# Patient Record
Sex: Female | Born: 1985 | Race: Black or African American | Hispanic: No | Marital: Single | Smoking: Former smoker
Health system: Southern US, Community
[De-identification: ages and names within clinical notes are randomized; demographics above are authoritative.]

## PROBLEM LIST (undated history)

## (undated) DIAGNOSIS — B999 Unspecified infectious disease: Secondary | ICD-10-CM

## (undated) DIAGNOSIS — Z8711 Personal history of peptic ulcer disease: Secondary | ICD-10-CM

## (undated) DIAGNOSIS — Z8719 Personal history of other diseases of the digestive system: Secondary | ICD-10-CM

## (undated) HISTORY — DX: Unspecified infectious disease: B99.9

## (undated) HISTORY — PX: TONSILLECTOMY AND ADENOIDECTOMY: SUR1326

---

## 2004-09-21 ENCOUNTER — Emergency Department (HOSPITAL_COMMUNITY): Admission: EM | Admit: 2004-09-21 | Discharge: 2004-09-21 | Payer: Self-pay | Admitting: Emergency Medicine

## 2005-09-23 ENCOUNTER — Emergency Department (HOSPITAL_COMMUNITY): Admission: EM | Admit: 2005-09-23 | Discharge: 2005-09-23 | Payer: Self-pay | Admitting: Emergency Medicine

## 2005-09-24 ENCOUNTER — Emergency Department (HOSPITAL_COMMUNITY): Admission: EM | Admit: 2005-09-24 | Discharge: 2005-09-24 | Payer: Self-pay | Admitting: Emergency Medicine

## 2005-09-26 ENCOUNTER — Emergency Department (HOSPITAL_COMMUNITY): Admission: EM | Admit: 2005-09-26 | Discharge: 2005-09-26 | Payer: Self-pay | Admitting: Emergency Medicine

## 2007-10-18 ENCOUNTER — Emergency Department (HOSPITAL_COMMUNITY): Admission: EM | Admit: 2007-10-18 | Discharge: 2007-10-18 | Payer: Self-pay | Admitting: Emergency Medicine

## 2008-01-05 ENCOUNTER — Emergency Department (HOSPITAL_COMMUNITY): Admission: EM | Admit: 2008-01-05 | Discharge: 2008-01-06 | Payer: Self-pay | Admitting: Emergency Medicine

## 2008-02-03 ENCOUNTER — Other Ambulatory Visit: Payer: Self-pay | Admitting: Family Medicine

## 2008-02-03 ENCOUNTER — Inpatient Hospital Stay (HOSPITAL_COMMUNITY): Admission: AD | Admit: 2008-02-03 | Discharge: 2008-02-07 | Payer: Self-pay | Admitting: Obstetrics

## 2008-03-26 ENCOUNTER — Ambulatory Visit (HOSPITAL_COMMUNITY): Admission: RE | Admit: 2008-03-26 | Discharge: 2008-03-26 | Payer: Self-pay | Admitting: Obstetrics

## 2010-09-24 ENCOUNTER — Encounter: Payer: Self-pay | Admitting: Obstetrics

## 2011-01-19 NOTE — Discharge Summary (Signed)
[name], [name]              ACCOUNT NO.:  [id number]   MEDICAL RECORD NO.:  [id number]          PATIENT TYPE:  INP   LOCATION:  9307                          FACILITY:  [facility]   PHYSICIAN:  Charles A. Clearance Coots, M.D.DATE OF BIRTH:  [birth year]   DATE OF ADMISSION:  02/03/2008  DATE OF DISCHARGE:  02/07/2008                               DISCHARGE SUMMARY   ADMITTING DIAGNOSES:  1. First trimester pregnancy.  2. Nausea.  3. Vomiting.  4. Dehydration.   DISCHARGE DIAGNOSES:  1. First trimester pregnancy.  2. Nausea.  3. Vomiting.  4. Dehydration.  5. Hyperemesis gravidarum at 11 weeks' gestation, responded well to IV      hydration and antibiotic therapy.  6. Discharged home undelivered at 3 weeks' gestation, much improved      in good condition.   REASON FOR ADMISSION:  A 25 year old G1 at 59 weeks' gestation presents  in transfer from [facility] for severe nausea, vomiting, and  dehydration.  The patient states that she cannot keep anything down.   PAST MEDICAL HISTORY:   SURGERY:  None.   ILLNESSES:  None.   MEDICATIONS:  None.   ALLERGIES:  No known drug allergies.   SOCIAL HISTORY:  Negative for tobacco, alcohol, or recreational drug  use.   FAMILY HISTORY:  No major illnesses listed   REVIEW OF SYSTEMS:  Positive for nausea and vomiting, and lower  abdominal pain.   PHYSICAL EXAMINATION:  GENERAL:  Well-nourished, well-developed female  in no acute distress.  VITAL SIGNS:  Temperature 97.3, pulse 101, respiratory rate 20, and  blood pressure 119/70.  LUNGS:  Clear to auscultation bilaterally.  HEART:  Regular rate and rhythm.  ABDOMEN:  Soft, positive epigastric tenderness.  PELVIC:  Omitted.   ADMITTING LABS:  Urinalysis reveals specific gravity of 1.024, greater  than 88 ketones; otherwise negative.  CBC revealed a hemoglobin of 12,  hematocrit of 36, white blood cell count of 8300, and platelets 218,000.  Comprehensive metabolic panel was  within normal limits.  Amylase and  lipase were within normal limits   HOSPITAL COURSE:  The patient was admitted and started on IV fluid  hydration along with the anti-pneumatic therapy and she slowly responded  quite well.  By hospital day #3, she was tolerating BRAT-type diet and  was able to tolerate fluids by hospital day #4, and was discharged home  undelivered at 11 weeks' gestation, much improved in good condition.   DISCHARGE DISPOSITION:  Medications, Zofran 4 mg p.o. with oral  disintegrating tabs q.6 h p.r.n. Bland-type diet was recommended with  instructions for hyperemesis.  The patient is to call office for  followup appointment in 1 week.      Charles A. Clearance Coots, M.D.  Electronically Signed     CAH/MEDQ  D:  02/27/2008  T:  02/28/2008  Job:  [id number]

## 2011-05-31 LAB — URINALYSIS, ROUTINE W REFLEX MICROSCOPIC
Leukocytes, UA: NEGATIVE
Nitrite: NEGATIVE
Specific Gravity, Urine: 1.028
pH: 6

## 2011-05-31 LAB — COMPREHENSIVE METABOLIC PANEL
BUN: 4 — ABNORMAL LOW
CO2: 20
Chloride: 100
Creatinine, Ser: 0.7
GFR calc non Af Amer: >60
Total Bilirubin: 1

## 2011-05-31 LAB — URINE MICROSCOPIC-ADD ON

## 2011-05-31 LAB — CBC
HCT: 32 — ABNORMAL LOW
MCHC: 33.6
MCV: 87.2
Platelets: 225
RBC: 3.67 — ABNORMAL LOW
WBC: 6.4

## 2011-05-31 LAB — DIFFERENTIAL
Basophils Absolute: 0
Lymphocytes Relative: 12
Neutro Abs: 5.1

## 2011-11-26 ENCOUNTER — Encounter: Payer: Self-pay | Admitting: Obstetrics and Gynecology

## 2011-11-28 ENCOUNTER — Encounter (HOSPITAL_COMMUNITY): Payer: Self-pay | Admitting: Emergency Medicine

## 2011-11-28 ENCOUNTER — Emergency Department (HOSPITAL_COMMUNITY): Payer: Medicaid Other

## 2011-11-28 ENCOUNTER — Emergency Department (HOSPITAL_COMMUNITY)
Admission: EM | Admit: 2011-11-28 | Discharge: 2011-11-28 | Disposition: A | Discharge status: Home Health | Payer: Medicaid Other | Attending: Emergency Medicine | Admitting: Emergency Medicine

## 2011-11-28 DIAGNOSIS — K529 Noninfective gastroenteritis and colitis, unspecified: Secondary | ICD-10-CM

## 2011-11-28 DIAGNOSIS — R197 Diarrhea, unspecified: Secondary | ICD-10-CM | POA: Insufficient documentation

## 2011-11-28 DIAGNOSIS — R109 Unspecified abdominal pain: Secondary | ICD-10-CM | POA: Insufficient documentation

## 2011-11-28 DIAGNOSIS — N200 Calculus of kidney: Secondary | ICD-10-CM

## 2011-11-28 DIAGNOSIS — R51 Headache: Secondary | ICD-10-CM | POA: Insufficient documentation

## 2011-11-28 DIAGNOSIS — M549 Dorsalgia, unspecified: Secondary | ICD-10-CM | POA: Insufficient documentation

## 2011-11-28 DIAGNOSIS — K5289 Other specified noninfective gastroenteritis and colitis: Secondary | ICD-10-CM | POA: Insufficient documentation

## 2011-11-28 DIAGNOSIS — R112 Nausea with vomiting, unspecified: Secondary | ICD-10-CM | POA: Insufficient documentation

## 2011-11-28 DIAGNOSIS — R509 Fever, unspecified: Secondary | ICD-10-CM | POA: Insufficient documentation

## 2011-11-28 HISTORY — DX: Personal history of other diseases of the digestive system: Z87.19

## 2011-11-28 HISTORY — DX: Personal history of peptic ulcer disease: Z87.11

## 2011-11-28 LAB — URINE MICROSCOPIC-ADD ON

## 2011-11-28 LAB — DIFFERENTIAL
Basophils Relative: 0 % (ref 0–1)
Eosinophils Relative: 0 % (ref 0–5)
Lymphs Abs: 0.2 10*3/uL — ABNORMAL LOW (ref 0.7–4.0)
Monocytes Absolute: 0.3 10*3/uL (ref 0.1–1.0)

## 2011-11-28 LAB — URINALYSIS, ROUTINE W REFLEX MICROSCOPIC
Bilirubin Urine: NEGATIVE
Protein, ur: 30 mg/dL — AB
Specific Gravity, Urine: 1.027 (ref 1.005–1.030)
Urobilinogen, UA: 1 mg/dL (ref 0.0–1.0)

## 2011-11-28 LAB — COMPREHENSIVE METABOLIC PANEL
ALT: 14 U/L (ref 0–35)
AST: 20 U/L (ref 0–37)
Albumin: 4.2 g/dL (ref 3.5–5.2)
Alkaline Phosphatase: 74 U/L (ref 39–117)
Potassium: 3.8 mEq/L (ref 3.5–5.1)
Sodium: 136 mEq/L (ref 135–145)
Total Protein: 7.5 g/dL (ref 6.0–8.3)

## 2011-11-28 LAB — CBC
MCH: 26.6 pg (ref 26.0–34.0)
MCV: 81.2 fL (ref 78.0–100.0)
Platelets: 250 10*3/uL (ref 150–400)
RBC: 4.25 MIL/uL (ref 3.87–5.11)

## 2011-11-28 MED ORDER — MORPHINE SULFATE 4 MG/ML IJ SOLN
4.0000 mg | Freq: Once | INTRAMUSCULAR | Status: AC
  Administered 2011-11-28: 4 mg via INTRAVENOUS
  Filled 2011-11-28: qty 1

## 2011-11-28 MED ORDER — ONDANSETRON 8 MG PO TBDP: 8.0000 mg | ORAL_TABLET | Freq: Three times a day (TID) | ORAL | Status: AC | PRN

## 2011-11-28 MED ORDER — ONDANSETRON HCL 4 MG/2ML IJ SOLN
4.0000 mg | Freq: Once | INTRAMUSCULAR | Status: AC
  Administered 2011-11-28: 4 mg via INTRAVENOUS
  Filled 2011-11-28: qty 2

## 2011-11-28 MED ORDER — OXYCODONE-ACETAMINOPHEN 5-325 MG PO TABS
2.0000 | ORAL_TABLET | Freq: Once | ORAL | Status: AC
  Administered 2011-11-28: 2 via ORAL
  Filled 2011-11-28: qty 2

## 2011-11-28 MED ORDER — OXYCODONE-ACETAMINOPHEN 5-325 MG PO TABS: 2.0000 | ORAL_TABLET | Freq: Four times a day (QID) | ORAL | Status: AC | PRN

## 2011-11-28 MED ORDER — SODIUM CHLORIDE 0.9 % IV SOLN
1000.0000 mL | Freq: Once | INTRAVENOUS | Status: AC
  Administered 2011-11-28: 1000 mL via INTRAVENOUS

## 2011-11-28 NOTE — ED Notes (Signed)
Pt given po fluids. 

## 2011-11-28 NOTE — ED Notes (Signed)
ZOX:[id number]<AV> Expected date:11/28/11<BR> Expected time: 9:27 AM<BR> Means of arrival:Ambulance<BR> Comments:<BR> N/v/d

## 2011-11-28 NOTE — ED Notes (Signed)
[facility] brings in pt from home with c/o N/V/D since 2330 last night. Pt also co of abdominal pain described as cramping.

## 2011-11-28 NOTE — ED Provider Notes (Signed)
History     CSN: [id number]  Arrival date & time 11/28/11  4696   First MD Initiated Contact with Patient 11/28/11 0957      Chief Complaint  Patient presents with  . Nausea    (Consider location/radiation/quality/duration/timing/severity/associated sxs/prior treatment) HPI Patient is a 25 roll female who presents today complaining of nausea, vomiting, and diarrhea as well as abdominal pain that began acutely at 11:30 last night. Patient describes her pain as a cramping sensation that is a 10 out of 10. She also complains of some left-sided flank pain. Her abdominal pain is focused on the left upper quadrant. 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 She has not seen any blood in her stool or emesis. She endorses a mild headache with this as well. Patient has history of presentation similar to this in the past where she had pyelonephritis. Patient denies any dysuria, hematuria, or increased urinary frequency. She has no history of nephrolithiasis. Patient denies fevers. Nothing has made her pain better or worse.There are no other associated or modifying factors.  Past Medical History  Diagnosis Date  . History of stomach ulcers     Past Surgical History  Procedure Date  . Tonsillectomy and adenoidectomy     History reviewed. No pertinent family history.  History  Substance Use Topics  . Smoking status: Never Smoker   . Smokeless tobacco: Never Used  . Alcohol Use: No    OB History    Grav Para Term Preterm Abortions TAB SAB Ect Mult Living                  Review of Systems  Constitutional: Positive for chills, appetite change and fatigue.  Eyes: Negative.   Respiratory: Negative.   Cardiovascular: Negative.   Gastrointestinal: Positive for nausea, vomiting, abdominal pain and diarrhea.  Genitourinary: Negative.   Musculoskeletal: Positive for back pain.  Skin: Negative.   Neurological: Positive for headaches.  Hematological: Negative.     Psychiatric/Behavioral: Negative.   All other systems reviewed and are negative.    Allergies  Aspirin and Penicillins  Home Medications  No current outpatient prescriptions on file.  BP 112/58  Pulse 100  Temp(Src) 97.7 F (36.5 C) (Oral)  Resp 14  SpO2 100%  LMP 11/18/2011  Physical Exam  Nursing note and vitals reviewed. GEN: Well-developed, well-nourished female in no distress, very uncomfortable appearing HEENT: Atraumatic, normocephalic. Oropharynx clear without erythema EYES: PERRLA BL, no scleral icterus. NECK: Trachea midline, no meningismus CV: regular rate and rhythm. No murmurs, rubs, or gallops PULM: No respiratory distress.  No crackles, wheezes, or rales. GI: soft, diffuse mild tenderness to palpation. The patient does not have an acute abdomen. No guarding, rebound. + bowel sounds  GU: deferred Neuro: cranial nerves 2-12 intact, no abnormalities of strength or sensation, A and O x 3 MSK: Patient moves all 4 extremities symmetrically, no deformity, edema, or injury noted Skin: No rashes petechiae, purpura, or jaundice Psych: no abnormality of mood   ED Course  Procedures (including critical care time)  Labs Reviewed  URINALYSIS, ROUTINE W REFLEX MICROSCOPIC - Abnormal; Notable for the following:    Hgb urine dipstick MODERATE (*)    Ketones, ur TRACE (*)    Protein, ur 30 (*)    All other components within normal limits  CBC - Abnormal; Notable for the following:    Hemoglobin 11.3 (*)    HCT 34.5 (*)    All other components within  normal limits  DIFFERENTIAL - Abnormal; Notable for the following:    Neutrophils Relative 93 (*)    Lymphocytes Relative 3 (*)    Lymphs Abs 0.2 (*)    All other components within normal limits  URINE MICROSCOPIC-ADD ON - Abnormal; Notable for the following:    Bacteria, UA MANY (*)    All other components within normal limits  COMPREHENSIVE METABOLIC PANEL - Abnormal; Notable for the following:    Glucose, Bld 100  (*)    All other components within normal limits  POCT PREGNANCY, URINE  LIPASE, BLOOD  COMPREHENSIVE METABOLIC PANEL  LIPASE, BLOOD   Ct Abdomen Pelvis Wo Contrast  11/28/2011  *RADIOLOGY REPORT*  Clinical Data: Left flank pain and hematuria  CT ABDOMEN AND PELVIS WITHOUT CONTRAST  Technique:  Multidetector CT imaging of the abdomen and pelvis was performed following the standard protocol without intravenous contrast. Sagittal and coronal MPR images reconstructed from axial data set.  Comparison: None.  Findings: Lung bases clear. Tiny bilateral nonobstructing renal calculi. No hydronephrosis, ureteral dilatation or ureteral calculus identified. Bladder unremarkable. Within limits of a nonenhanced exam, no focal abnormalities of the liver, spleen, pancreas, kidneys, or adrenal glands otherwise seen. Normal-appearing bladder, uterus and adnexae for technique.  Base of appendix is normal appearance, distally obscured by bowel loops. Stomach and bowel loops unremarkable for technique. Scattered pelvic phleboliths. No mass, adenopathy, free fluid or inflammatory process. Tiny umbilical hernia containing fat. No acute osseous findings.  IMPRESSION: No acute intra abdominal or intrapelvic abnormalities. Tiny bilateral nonobstructing renal calculi.  Original Report Authenticated By: Lollie Marrow, M.D.     1. Nephrolithiasis   2. Gastroenteritis       MDM  Patient was evaluated by myself. Based on evaluation patient was treated symptomatically with morphine, IV fluids, and Zofran. Workup for her abdominal pain was performed. This included renal panel, hepatic panel, lipase, urinalysis, urine pregnancy test. There was noted some blood on patient's urinalysis and patient denied having her menses at this time. CT abdomen and pelvis noncontrast performed. Tiny bilateral nonobstructing renal calculi were identified. Patient improved with IV therapy. Following 2 L of IV fluid patient was feeling better.  Patient was discharged home with prescription for Zofran as well as 15 tabs of Vicodin. She was notified of the finding of small renal calculi. Patient and family were comfortable with plan for discharge home. Patient was discharged in good condition.        Cyndra Numbers, MD 11/28/11 [phone]

## 2011-11-28 NOTE — Discharge Instructions (Signed)
Viral Gastroenteritis Viral gastroenteritis is also known as stomach flu. This condition affects the stomach and intestinal tract. It can cause sudden diarrhea and vomiting. The illness typically lasts 3 to 8 days. Most people develop an immune response that eventually gets rid of the virus. While this natural response develops, the virus can make you quite ill. CAUSES  Many different viruses can cause gastroenteritis, such as rotavirus or noroviruses. You can catch one of these viruses by consuming contaminated food or water. You may also catch a virus by sharing utensils or other personal items with an infected person or by touching a contaminated surface. SYMPTOMS  The most common symptoms are diarrhea and vomiting. These problems can cause a severe loss of body fluids (dehydration) and a body salt (electrolyte) imbalance. Other symptoms may include:  Fever.   Headache.   Fatigue.   Abdominal pain.  DIAGNOSIS  Your caregiver can usually diagnose viral gastroenteritis based on your symptoms and a physical exam. A stool sample may also be taken to test for the presence of viruses or other infections. TREATMENT  This illness typically goes away on its own. Treatments are aimed at rehydration. The most serious cases of viral gastroenteritis involve vomiting so severely that you are not able to keep fluids down. In these cases, fluids must be given through an intravenous line (IV). HOME CARE INSTRUCTIONS   Drink enough fluids to keep your urine clear or pale yellow. Drink small amounts of fluids frequently and increase the amounts as tolerated.   Ask your caregiver for specific rehydration instructions.   Avoid:   Foods high in sugar.   Alcohol.   Carbonated drinks.   Tobacco.   Juice.   Caffeine drinks.   Extremely hot or cold fluids.   Fatty, greasy foods.   Too much intake of anything at one time.   Dairy products until 24 to 48 hours after diarrhea stops.   You may  consume probiotics. Probiotics are active cultures of beneficial bacteria. They may lessen the amount and number of diarrheal stools in adults. Probiotics can be found in yogurt with active cultures and in supplements.   Wash your hands well to avoid spreading the virus.   Only take over-the-counter or prescription medicines for pain, discomfort, or fever as directed by your caregiver. Do not give aspirin to children. Antidiarrheal medicines are not recommended.   Ask your caregiver if you should continue to take your regular prescribed and over-the-counter medicines.   Keep all follow-up appointments as directed by your caregiver.  SEEK IMMEDIATE MEDICAL CARE IF:   You are unable to keep fluids down.   You do not urinate at least once every 6 to 8 hours.   You develop shortness of breath.   You notice blood in your stool or vomit. This may look like coffee grounds.   You have abdominal pain that increases or is concentrated in one small area (localized).   You have persistent vomiting or diarrhea.   You have a fever.   The patient is a child younger than 3 months, and he or she has a fever.   The patient is a child older than 3 months, and he or she has a fever and persistent symptoms.   The patient is a child older than 3 months, and he or she has a fever and symptoms suddenly get worse.   The patient is a baby, and he or she has no tears when crying.  MAKE SURE YOU:     Understand these instructions.   Will watch your condition.   Will get help right away if you are not doing well or get worse.  Document Released: 08/20/2005 Document Revised: 08/09/2011 Document Reviewed: 06/06/2011 [facility] Patient Information 2012 [place], [place].Kidney Stones Kidney stones (ureteral lithiasis) are deposits that form inside your kidneys. The intense pain is caused by the stone moving through the urinary tract. When the stone moves, the ureter goes into spasm around the stone. The stone is  usually passed in the urine.  CAUSES   A disorder that makes certain neck glands produce too much parathyroid hormone (primary hyperparathyroidism).   A buildup of uric acid crystals.   Narrowing (stricture) of the ureter.   A kidney obstruction present at birth (congenital obstruction).   Previous surgery on the kidney or ureters.   Numerous kidney infections.  SYMPTOMS   Feeling sick to your stomach (nauseous).   Throwing up (vomiting).   Blood in the urine (hematuria).   Pain that usually spreads (radiates) to the groin.   Frequency or urgency of urination.  DIAGNOSIS   Taking a history and physical exam.   Blood or urine tests.   Computerized X-ray scan (CT scan).   Occasionally, an examination of the inside of the urinary bladder (cystoscopy) is performed.  TREATMENT   Observation.   Increasing your fluid intake.   Surgery may be needed if you have severe pain or persistent obstruction.  The size, location, and chemical composition are all important variables that will determine the proper choice of action for you. Talk to your caregiver to better understand your situation so that you will minimize the risk of injury to yourself and your kidney.  HOME CARE INSTRUCTIONS   Drink enough water and fluids to keep your urine clear or pale yellow.   Strain all urine through the provided strainer. Keep all particulate matter and stones for your caregiver to see. The stone causing the pain may be as small as a grain of salt. It is very important to use the strainer each and every time you pass your urine. The collection of your stone will allow your caregiver to analyze it and verify that a stone has actually passed.   Only take over-the-counter or prescription medicines for pain, discomfort, or fever as directed by your caregiver.   Make a follow-up appointment with your caregiver as directed.   Get follow-up X-rays if required. The absence of pain does not always  mean that the stone has passed. It may have only stopped moving. If the urine remains completely obstructed, it can cause loss of kidney function or even complete destruction of the kidney. It is your responsibility to make sure X-rays and follow-ups are completed. Ultrasounds of the kidney can show blockages and the status of the kidney. Ultrasounds are not associated with any radiation and can be performed easily in a matter of minutes.  SEEK IMMEDIATE MEDICAL CARE IF:   Pain cannot be controlled with the prescribed medicine.   You have a fever.   The severity or intensity of pain increases over 18 hours and is not relieved by pain medicine.   You develop a new onset of abdominal pain.   You feel faint or pass out.  MAKE SURE YOU:   Understand these instructions.   Will watch your condition.   Will get help right away if you are not doing well or get worse.  Document Released: 08/20/2005 Document Revised: 08/09/2011 Document Reviewed: 12/16/2009 [employer] Patient Information  [address] [place].

## 2011-11-28 NOTE — Progress Notes (Signed)
Pt states she has [place] [employer] insurance but does not have card only a letter PCP [employer] Dr Judeth Cornfield Ansel Bong [facility] community liasion services

## 2011-12-17 ENCOUNTER — Encounter: Payer: Self-pay | Admitting: Obstetrics and Gynecology

## 2011-12-31 ENCOUNTER — Encounter: Payer: Self-pay | Admitting: Obstetrics and Gynecology

## 2012-04-29 ENCOUNTER — Ambulatory Visit (INDEPENDENT_AMBULATORY_CARE_PROVIDER_SITE_OTHER): Payer: BC Managed Care – PPO | Admitting: Internal Medicine

## 2012-04-29 ENCOUNTER — Ambulatory Visit: Payer: BC Managed Care – PPO

## 2012-04-29 VITALS — BP 98/62 | HR 91 | Temp 98.6°F | Resp 16 | Ht 63.5 in | Wt 155.6 lb

## 2012-04-29 DIAGNOSIS — R109 Unspecified abdominal pain: Secondary | ICD-10-CM

## 2012-04-29 DIAGNOSIS — K5900 Constipation, unspecified: Secondary | ICD-10-CM

## 2012-04-29 DIAGNOSIS — R319 Hematuria, unspecified: Secondary | ICD-10-CM

## 2012-04-29 DIAGNOSIS — N23 Unspecified renal colic: Secondary | ICD-10-CM

## 2012-04-29 LAB — POCT UA - MICROSCOPIC ONLY: Casts, Ur, LPF, POC: NEGATIVE

## 2012-04-29 LAB — POCT CBC
Hemoglobin: 10.1 g/dL — AB (ref 12.2–16.2)
Lymph, poc: 3.2 (ref 0.6–3.4)
MCHC: 30 g/dL — AB (ref 31.8–35.4)
MID (cbc): 0.4 (ref 0–0.9)
MPV: 8.9 fL (ref 0–99.8)
POC Granulocyte: 2.8 (ref 2–6.9)
POC MID %: 6.5 %M (ref 0–12)
Platelet Count, POC: 288 10*3/uL (ref 142–424)
RDW, POC: 16.6 %

## 2012-04-29 LAB — POCT URINALYSIS DIPSTICK
Bilirubin, UA: NEGATIVE
Ketones, UA: NEGATIVE
Leukocytes, UA: NEGATIVE
pH, UA: 6

## 2012-04-29 MED ORDER — TRAMADOL HCL 50 MG PO TABS: 50.0000 mg | ORAL_TABLET | Freq: Three times a day (TID) | ORAL | Status: AC | PRN

## 2012-04-29 MED ORDER — POLYETHYLENE GLYCOL 3350 17 GM/SCOOP PO POWD: 17.0000 g | Freq: Two times a day (BID) | ORAL | Status: AC | PRN

## 2012-04-29 NOTE — Patient Instructions (Signed)
Miralax 1capful daily until you have a bowel movement. Ultram 1 tab every 4 hours as needed for pain. Increase fluids and fiber in the diet. If your symptoms worsen return to the office.

## 2012-04-29 NOTE — Progress Notes (Signed)
Subjective:    Patient ID: [name], female    DOB: 14-May-1986, 26 y.o.   MRN: [id number]  HPI L Back pain,L flank pain,LUQ abd pain Onset 2 days ago Moderate in severity Nausea No vomiting No fever No dysuria Drank etoh 2 days ago Hx kidney stone and an ulcer   Review of Systems  Constitutional: Positive for appetite change and fatigue.  Gastrointestinal: Positive for abdominal pain.  Genitourinary: Positive for flank pain.  All other systems reviewed and are negative.       Objective:   Physical Exam  Nursing note and vitals reviewed. Constitutional: She is oriented to person, place, and time. She appears well-developed and well-nourished.  HENT:  Head: Normocephalic and atraumatic.  Left Ear: External ear normal.  Nose: Nose normal.  Mouth/Throat: Oropharynx is clear and moist.  Eyes: Conjunctivae and EOM are normal. Pupils are equal, round, and reactive to light.  Neck: Normal range of motion. Neck supple.  Cardiovascular: Normal rate, regular rhythm, normal heart sounds and intact distal pulses.   Pulmonary/Chest: Effort normal and breath sounds normal.  Abdominal: There is tenderness.       Flank pain l  Musculoskeletal: Normal range of motion.  Neurological: She is alert and oriented to person, place, and time. She has normal reflexes.  Skin: Skin is warm and dry.  Psychiatric: She has a normal mood and affect. Her behavior is normal. Judgment and thought content normal.     Results for orders placed in visit on 04/29/12  POCT URINALYSIS DIPSTICK      Component Value Range   Color, UA yellow     Clarity, UA clear     Glucose, UA neg     Bilirubin, UA neg     Ketones, UA neg     Spec Grav, UA >=1.030     Blood, UA trace     pH, UA 6.0     Protein, UA neg     Urobilinogen, UA 0.2     Nitrite, UA neg     Leukocytes, UA Negative    POCT UA - MICROSCOPIC ONLY      Component Value Range   WBC, Ur, HPF, POC 1-3     RBC, urine,  microscopic 2-4     Bacteria, U Microscopic small     Mucus, UA small     Epithelial cells, urine per micros 1-3     Crystals, Ur, HPF, POC neg     Casts, Ur, LPF, POC neg     Yeast, UA neg        Results for orders placed in visit on 04/29/12  POCT URINALYSIS DIPSTICK      Component Value Range   Color, UA yellow     Clarity, UA clear     Glucose, UA neg     Bilirubin, UA neg     Ketones, UA neg     Spec Grav, UA >=1.030     Blood, UA trace     pH, UA 6.0     Protein, UA neg     Urobilinogen, UA 0.2     Nitrite, UA neg     Leukocytes, UA Negative    POCT UA - MICROSCOPIC ONLY      Component Value Range   WBC, Ur, HPF, POC 1-3     RBC, urine, microscopic 2-4     Bacteria, U Microscopic small     Mucus, UA small     Epithelial cells,  urine per micros 1-3     Crystals, Ur, HPF, POC neg     Casts, Ur, LPF, POC neg     Yeast, UA neg    POCT URINE PREGNANCY      Component Value Range   Preg Test, Ur Negative    POCT CBC      Component Value Range   WBC 6.4  4.6 - 10.2 K/uL   Lymph, poc 3.2  0.6 - 3.4   POC LYMPH PERCENT 49.8  10 - 50 %L   MID (cbc) 0.4  0 - 0.9   POC MID % 6.5  0 - 12 %M   POC Granulocyte 2.8  2 - 6.9   Granulocyte percent 43.7  37 - 80 %G   RBC 4.03 (*) 4.04 - 5.48 M/uL   Hemoglobin 10.1 (*) 12.2 - 16.2 g/dL   HCT, POC 16.1 (*) 09.6 - 47.9 %   MCV 83.5  80 - 97 fL   MCH, POC 25.1 (*) 27 - 31.2 pg   MCHC 30.0 (*) 31.8 - 35.4 g/dL   RDW, POC 04.5     Platelet Count, POC 288  142 - 424 K/uL   MPV 8.9  0 - 99.8 fL  cbc normal ua a with trace blood, preg negitive.th [facility] reading (PRIMARY) by  Dr.Hyder Deman increased Stool in left colon no free air no obstruction.  Assessment & Plan:  Flank nad abd pain Will access cbc ua urine preg ua awith blood preg neg xtray with stool in the left colon. Will rx for obstipation with miralax and also will rx with ultram for renal colic. Pt is allergic to asa so will avoid antinflams

## 2012-04-29 NOTE — Progress Notes (Signed)
  Subjective:    Patient ID: [name], female    DOB: 1986/06/14, 26 y.o.   MRN: [id number]  HPI    Review of Systems     Objective:   Physical Exam        Assessment & Plan:  Anemia Menstruating patient

## 2013-02-06 ENCOUNTER — Emergency Department (HOSPITAL_COMMUNITY)
Admission: EM | Admit: 2013-02-06 | Discharge: 2013-02-06 | Disposition: A | Discharge status: Home / Self Care | Payer: BC Managed Care – PPO | Attending: Emergency Medicine | Admitting: Emergency Medicine

## 2013-02-06 DIAGNOSIS — R109 Unspecified abdominal pain: Secondary | ICD-10-CM | POA: Insufficient documentation

## 2013-02-06 DIAGNOSIS — Z8711 Personal history of peptic ulcer disease: Secondary | ICD-10-CM | POA: Insufficient documentation

## 2013-02-06 DIAGNOSIS — Z3202 Encounter for pregnancy test, result negative: Secondary | ICD-10-CM | POA: Insufficient documentation

## 2013-02-06 DIAGNOSIS — R3915 Urgency of urination: Secondary | ICD-10-CM | POA: Insufficient documentation

## 2013-02-06 DIAGNOSIS — R350 Frequency of micturition: Secondary | ICD-10-CM | POA: Insufficient documentation

## 2013-02-06 DIAGNOSIS — N83209 Unspecified ovarian cyst, unspecified side: Secondary | ICD-10-CM

## 2013-02-06 LAB — URINALYSIS, ROUTINE W REFLEX MICROSCOPIC
Ketones, ur: NEGATIVE mg/dL
Leukocytes, UA: NEGATIVE
Nitrite: NEGATIVE
Protein, ur: NEGATIVE mg/dL
Urobilinogen, UA: 0.2 mg/dL (ref 0.0–1.0)

## 2013-02-06 MED ORDER — HYDROCODONE-ACETAMINOPHEN 5-325 MG PO TABS: 1.0000 | ORAL_TABLET | Freq: Four times a day (QID) | ORAL | Status: DC | PRN

## 2013-02-06 MED ORDER — LIDOCAINE HCL (PF) 1 % IJ SOLN
INTRAMUSCULAR | Status: AC
  Administered 2013-02-06: 2 mL via INTRAMUSCULAR
  Filled 2013-02-06: qty 5

## 2013-02-06 MED ORDER — CEFTRIAXONE SODIUM 250 MG IJ SOLR
250.0000 mg | Freq: Once | INTRAMUSCULAR | Status: AC
  Administered 2013-02-06: 250 mg via INTRAMUSCULAR
  Filled 2013-02-06: qty 250

## 2013-02-06 NOTE — ED Notes (Signed)
Patient C/O feeling bladder pressure and urge to urinate beginning Monday.  Symptoms worsened Wednesday and Thursday which prompted today's ED visit.

## 2013-02-06 NOTE — ED Provider Notes (Addendum)
History     CSN: [id number]  Arrival date & time 02/06/13  1112   First MD Initiated Contact with Patient 02/06/13 1124      Chief Complaint  Patient presents with  . Urinary Tract Infection    (Consider location/radiation/quality/duration/timing/severity/associated sxs/prior treatment) Patient is a 27 y.o. female presenting with urinary tract infection. The history is provided by the patient.  Urinary Tract Infection This is a new ( pattiient has had a bladder pressure with urgency for the last 4 days I continues to worsen) problem. The current episode started more than 2 days ago. The problem occurs constantly. The problem has been gradually worsening. Associated symptoms include abdominal pain. Associated symptoms comments: No fever, flank pain, nausea or vomiting. Exacerbated by: Urinating. Relieved by: Urination. She has tried nothing for the symptoms. The treatment provided no relief.    Past Medical History  Diagnosis Date  . History of stomach ulcers     Past Surgical History  Procedure Laterality Date  . Tonsillectomy and adenoidectomy      No family history on file.  History  Substance Use Topics  . Smoking status: Never Smoker   . Smokeless tobacco: Never Used  . Alcohol Use: No    OB History   Grav Para Term Preterm Abortions TAB SAB Ect Mult Living                  Review of Systems  Constitutional: Negative for fever.  Gastrointestinal: Positive for abdominal pain. Negative for nausea and vomiting.  Genitourinary: Positive for urgency and frequency. Negative for dysuria, flank pain, vaginal bleeding and vaginal discharge.  All other systems reviewed and are negative.    Allergies  Aspirin  Home Medications  No current outpatient prescriptions on file.  BP 113/72  Pulse 82  Temp(Src) 98.3 F (36.8 C) (Oral)  Resp 18  SpO2 100%  Physical Exam  Nursing note and vitals reviewed. Constitutional: She is oriented to person, place, and time.  She appears well-developed and well-nourished. No distress.  HENT:  Head: Normocephalic and atraumatic.  Mouth/Throat: Oropharynx is clear and moist.  Eyes: Conjunctivae and EOM are normal. Pupils are equal, round, and reactive to light.  Neck: Normal range of motion. Neck supple.  Cardiovascular: Normal rate, regular rhythm and intact distal pulses.   No murmur heard. Pulmonary/Chest: Effort normal and breath sounds normal. No respiratory distress. She has no wheezes. She has no rales.  Abdominal: Soft. She exhibits no distension. There is tenderness in the suprapubic area. There is no rebound and no guarding.  Genitourinary: Uterus normal. Cervix exhibits no motion tenderness and no discharge. Right adnexum displays tenderness. Right adnexum displays no mass and no fullness. Left adnexum displays no mass, no tenderness and no fullness. No bleeding around the vagina. No vaginal discharge found.  Small amt of blood at the cervical os  Musculoskeletal: Normal range of motion. She exhibits no edema and no tenderness.  Neurological: She is alert and oriented to person, place, and time.  Skin: Skin is warm and dry. No rash noted. No erythema.  Psychiatric: She has a normal mood and affect. Her behavior is normal.    ED Course  Procedures (including critical care time)  Labs Reviewed  WET PREP, GENITAL - Abnormal; Notable for the following:    Clue Cells Wet Prep HPF POC FEW (*)    WBC, Wet Prep HPF POC TOO NUMEROUS TO COUNT (*)    All other components within normal limits  GC/CHLAMYDIA PROBE AMP  URINALYSIS, ROUTINE W REFLEX MICROSCOPIC  POCT PREGNANCY, URINE   No results found.   1. Ovarian cyst       MDM   Patient with bladder pressure and urgency for the last 4 days. States that she does not feel she is emptying her bladder completely however bedside ultrasound shows an empty bladder after urination. Normal menses without any vaginal complaints and denies unprotected sex.  Feel  most likely UTI uncomplicated without signs of pyelonephritis. UA and UPT  UA wnl.  UPT neg.  However due to pt's sx will do a pelvic exam for further evaluation.  12:58 PM Pelvic exam with right adnexal tenderness but no CMT or discharge.  Small amt of blood at the os.  Wet prep with too numerous to count wbc's.  Will treat with rocephin IM but pt already on doxy bid.  Feel most likely pt has ovarian cyst putting pressure on the bladder making her feel that she needs to urinate.  Do not feel she has TOA or torsed ovary at this time requiring U/S will have f/u with gyn if sx worsen.    Gwyneth Sprout, MD 02/06/13 1259  Gwyneth Sprout, MD 02/06/13 1302  Gwyneth Sprout, MD 02/06/13 1404  Gwyneth Sprout, MD 02/06/13 1406

## 2013-02-06 NOTE — ED Notes (Signed)
C/O bladder discomfort beginning Monday.  Denies dysuria but C/O urge to urinate that awakens her at night.

## 2013-02-07 LAB — GC/CHLAMYDIA PROBE AMP
CT Probe RNA: NEGATIVE
GC Probe RNA: NEGATIVE

## 2013-10-28 ENCOUNTER — Emergency Department (HOSPITAL_COMMUNITY)
Admission: EM | Admit: 2013-10-28 | Discharge: 2013-10-28 | Disposition: A | Discharge status: Home / Self Care | Payer: BC Managed Care – PPO | Attending: Emergency Medicine | Admitting: Emergency Medicine

## 2013-10-28 ENCOUNTER — Emergency Department (HOSPITAL_COMMUNITY): Payer: BC Managed Care – PPO

## 2013-10-28 ENCOUNTER — Encounter (HOSPITAL_COMMUNITY): Payer: Self-pay | Admitting: Emergency Medicine

## 2013-10-28 DIAGNOSIS — J111 Influenza due to unidentified influenza virus with other respiratory manifestations: Secondary | ICD-10-CM | POA: Insufficient documentation

## 2013-10-28 DIAGNOSIS — J029 Acute pharyngitis, unspecified: Secondary | ICD-10-CM | POA: Insufficient documentation

## 2013-10-28 DIAGNOSIS — Z9089 Acquired absence of other organs: Secondary | ICD-10-CM | POA: Insufficient documentation

## 2013-10-28 DIAGNOSIS — R69 Illness, unspecified: Secondary | ICD-10-CM

## 2013-10-28 DIAGNOSIS — Z8711 Personal history of peptic ulcer disease: Secondary | ICD-10-CM | POA: Insufficient documentation

## 2013-10-28 DIAGNOSIS — Z8719 Personal history of other diseases of the digestive system: Secondary | ICD-10-CM | POA: Insufficient documentation

## 2013-10-28 DIAGNOSIS — R42 Dizziness and giddiness: Secondary | ICD-10-CM | POA: Insufficient documentation

## 2013-10-28 LAB — BASIC METABOLIC PANEL
BUN: 6 mg/dL (ref 6–23)
CALCIUM: 8.8 mg/dL (ref 8.4–10.5)
CO2: 24 meq/L (ref 19–32)
CREATININE: 0.96 mg/dL (ref 0.50–1.10)
Chloride: 104 mEq/L (ref 96–112)
GFR calc Af Amer: >90 mL/min (ref >90)
GFR, EST NON AFRICAN AMERICAN: 80 mL/min — AB (ref >90)
Glucose, Bld: 86 mg/dL (ref 70–99)
Potassium: 3.7 mEq/L (ref 3.7–5.3)
Sodium: 139 mEq/L (ref 137–147)

## 2013-10-28 LAB — CBC
HCT: 27.9 % — ABNORMAL LOW (ref 36.0–46.0)
Hemoglobin: 8.8 g/dL — ABNORMAL LOW (ref 12.0–15.0)
MCH: 25.7 pg — AB (ref 26.0–34.0)
MCHC: 31.5 g/dL (ref 30.0–36.0)
MCV: 81.3 fL (ref 78.0–100.0)
PLATELETS: 165 10*3/uL (ref 150–400)
RBC: 3.43 MIL/uL — AB (ref 3.87–5.11)
RDW: 15.2 % (ref 11.5–15.5)
WBC: 3 10*3/uL — ABNORMAL LOW (ref 4.0–10.5)

## 2013-10-28 MED ORDER — ACETAMINOPHEN 325 MG PO TABS
650.0000 mg | ORAL_TABLET | Freq: Once | ORAL | Status: AC
  Administered 2013-10-28: 650 mg via ORAL
  Filled 2013-10-28: qty 2

## 2013-10-28 MED ORDER — HYDROCODONE-HOMATROPINE 5-1.5 MG/5ML PO SYRP: 5.0000 mL | ORAL_SOLUTION | Freq: Four times a day (QID) | ORAL | Status: AC | PRN

## 2013-10-28 MED ORDER — IBUPROFEN 800 MG PO TABS
800.0000 mg | ORAL_TABLET | Freq: Once | ORAL | Status: AC
  Administered 2013-10-28: 800 mg via ORAL
  Filled 2013-10-28: qty 1

## 2013-10-28 MED ORDER — SODIUM CHLORIDE 0.9 % IV BOLUS (SEPSIS)
1000.0000 mL | Freq: Once | INTRAVENOUS | Status: AC
  Administered 2013-10-28: 1000 mL via INTRAVENOUS

## 2013-10-28 MED ORDER — ALBUTEROL SULFATE HFA 108 (90 BASE) MCG/ACT IN AERS
2.0000 | INHALATION_SPRAY | Freq: Once | RESPIRATORY_TRACT | Status: AC
  Administered 2013-10-28: 2 via RESPIRATORY_TRACT
  Filled 2013-10-28: qty 6.7

## 2013-10-28 MED ORDER — AEROCHAMBER PLUS W/MASK MISC
1.0000 | Freq: Once | Status: AC
  Administered 2013-10-28: 1
  Filled 2013-10-28: qty 1

## 2013-10-28 NOTE — ED Provider Notes (Signed)
Medical screening examination/treatment/procedure(s) were performed by non-physician practitioner and as supervising physician I was immediately available for consultation/collaboration.     Geoffery Lyonsouglas Edvardo Honse, MD 10/28/13 1351

## 2013-10-28 NOTE — ED Notes (Signed)
States has had cough since Sunday body aches chills since yesterday

## 2013-10-28 NOTE — ED Provider Notes (Signed)
CSN: [id number]     Arrival date & time 10/28/13  1034 History   First MD Initiated Contact with Patient 10/28/13 1113     Chief Complaint  Patient presents with  . Influenza     (Consider location/radiation/quality/duration/timing/severity/associated sxs/prior Treatment) Patient is a 28 y.o. female presenting with flu symptoms. The history is provided by the patient and medical records. No language interpreter was used.  Influenza Presenting symptoms: cough, fatigue, headache, rhinorrhea and sore throat   Presenting symptoms: no diarrhea, no fever, no myalgias, no nausea, no shortness of breath and no vomiting   Associated symptoms: chills and nasal congestion   Associated symptoms: no ear pain and no neck stiffness     [name] is a 28 y.o. female  with a hx of PUD presents to the Emergency Department complaining of gradual, persistent, progressively worsening chest congestion, with associated nasal congestion, sore throat, generalized, throbbing headache onset 4 days ago. Patient reports she has associated bodyaches and chills but has not measured her temperature. She also reports chest pain with coughing only that is worse with movement and deep inspiration. She denies known sick contacts. She reports taking TheraFlu without improvement and nothing seems to make her symptoms worse.  Today she reports feeling lightheaded which is what prompted her visit to the emergency department. She reports decreased by mouth intake including fluids because of feeling unwell. She denies neck pain, neck stiffness, shortness of breath, abdominal pain, nausea, vomiting, diarrhea, dizziness, syncope, dysuria, hematuria.    Past Medical History  Diagnosis Date  . History of stomach ulcers    Past Surgical History  Procedure Laterality Date  . Tonsillectomy and adenoidectomy     No family history on file. History  Substance Use Topics  . Smoking status: Never Smoker   . Smokeless  tobacco: Never Used  . Alcohol Use: No   OB History   Grav Para Term Preterm Abortions TAB SAB Ect Mult Living                 Review of Systems  Constitutional: Positive for chills and fatigue. Negative for fever, diaphoresis, appetite change and unexpected weight change.  HENT: Positive for congestion, postnasal drip, rhinorrhea, sinus pressure and sore throat. Negative for ear discharge, ear pain and mouth sores.   Eyes: Negative for visual disturbance.  Respiratory: Positive for cough and chest tightness. Negative for shortness of breath, wheezing and stridor.   Cardiovascular: Positive for chest pain (with coughing only). Negative for palpitations and leg swelling.  Gastrointestinal: Negative for nausea, vomiting, abdominal pain, diarrhea and constipation.  Endocrine: Negative for polydipsia, polyphagia and polyuria.  Genitourinary: Negative for dysuria, urgency, frequency and hematuria.  Musculoskeletal: Negative for arthralgias, back pain, myalgias and neck stiffness.  Skin: Negative for rash.  Allergic/Immunologic: Negative for immunocompromised state.  Neurological: Positive for headaches. Negative for syncope, light-headedness and numbness.  Hematological: Negative for adenopathy. Does not bruise/bleed easily.  Psychiatric/Behavioral: Negative for sleep disturbance. The patient is not nervous/anxious.   All other systems reviewed and are negative.      Allergies  Aspirin  Home Medications   Current Outpatient Rx  Name  Route  Sig  Dispense  Refill  . DM-Doxylamine-Acetaminophen (NYQUIL COLD & FLU PO)   Oral   Take 2 capsules by mouth at bedtime as needed (flu symptoms).         . Ibuprofen (ADVIL PO)   Oral   Take 2 tablets by mouth at bedtime as needed (  body aches).         . Multiple Vitamin (MULTIVITAMIN WITH MINERALS) TABS   Oral   Take 1 tablet by mouth daily.         Marland Kitchen Phenyleph-Diphenhyd-DM-APAP (THERAFLU SEVERE COLD & COUGH) PACKET MISC    Oral   Take 1 packet by mouth daily as needed (flu symptoms).         Marland Kitchen HYDROcodone-homatropine (HYCODAN) 5-1.5 MG/5ML syrup   Oral   Take 5 mLs by mouth every 6 (six) hours as needed for cough.   120 mL   0    BP 120/71  Pulse 85  Temp(Src) 100.1 F (37.8 C) (Oral)  Resp 16  SpO2 100% Physical Exam  Nursing note and vitals reviewed. Constitutional: She is oriented to person, place, and time. She appears well-developed and well-nourished. No distress.  Awake, alert, nontoxic appearance  HENT:  Head: Normocephalic and atraumatic.  Right Ear: Tympanic membrane, external ear and ear canal normal.  Left Ear: Tympanic membrane, external ear and ear canal normal.  Nose: Mucosal edema and rhinorrhea present. No epistaxis. Right sinus exhibits no maxillary sinus tenderness and no frontal sinus tenderness. Left sinus exhibits no maxillary sinus tenderness and no frontal sinus tenderness.  Mouth/Throat: Uvula is midline and mucous membranes are normal. Mucous membranes are not pale and not cyanotic. Normal dentition. No uvula swelling. Posterior oropharyngeal erythema present. No oropharyngeal exudate, posterior oropharyngeal edema or tonsillar abscesses.  Mouth erythema of the posterior oral pharynx no exudate or edema  Eyes: Conjunctivae are normal. Pupils are equal, round, and reactive to light. No scleral icterus.  Neck: Normal range of motion and full passive range of motion without pain. Neck supple.  Cardiovascular: Normal rate, regular rhythm, normal heart sounds and intact distal pulses.   No murmur heard. Regular rate and rhythm, no tachycardia  Pulmonary/Chest: Effort normal and breath sounds normal. No stridor. No respiratory distress. She has no wheezes.  Course breath sounds throughout no focal wheezing, rales or rhonchi  Abdominal: Soft. Bowel sounds are normal. She exhibits no mass. There is no tenderness. There is no rebound and no guarding.  Abdomen soft and nontender   Musculoskeletal: Normal range of motion. She exhibits no edema and no tenderness.  No peripheral edema or calf tenderness No palpable cord  Lymphadenopathy:    She has no cervical adenopathy.  Neurological: She is alert and oriented to person, place, and time.  Speech is clear and goal oriented Moves extremities without ataxia  Skin: Skin is warm and dry. No rash noted. She is not diaphoretic. No erythema.  Psychiatric: She has a normal mood and affect.    ED Course  Procedures (including critical care time) Labs Review Labs Reviewed  CBC - Abnormal; Notable for the following:    WBC 3.0 (*)    RBC 3.43 (*)    Hemoglobin 8.8 (*)    HCT 27.9 (*)    MCH 25.7 (*)    All other components within normal limits  BASIC METABOLIC PANEL - Abnormal; Notable for the following:    GFR calc non Af Amer 80 (*)    All other components within normal limits   Imaging Review Dg Chest 2 View  10/28/2013   CLINICAL DATA:  Cough  EXAM: CHEST  2 VIEW  COMPARISON:  Chest x-ray dated December 16, 2007  FINDINGS: The lungs remain mildly hyperinflated. There is no focal infiltrate. The cardiac silhouette is normal in size. The pulmonary vascularity is not engorged.  There is no pleural effusion. The mediastinum is normal in width. The observed portions of the bony thorax appear normal.  IMPRESSION: There is mild stable hyperinflation which may reflect underlying COPD or reactive airway disease. There is no evidence of active cardiopulmonary disease.   Electronically Signed   By: David  [place]   On: 10/28/2013 12:42    EKG Interpretation   None       MDM   Final diagnoses:  Influenza-like illness   [name] presents with history and physical consistent with URI. Patient with decreased by mouth intake, will give fluid bolus, check basic labs and obtain chest x-ray.  No focal lung sounds, doubt pneumonia.  Patient alert, oriented, nontoxic and nonseptic appearing. Afebrile,  non-tachycardic and not hypoxic here in the emergency department.  1:22 PM Pt feeling much better after fluids and motrin.  Patient with symptoms consistent with influenza.  Vitals are stable, low-grade fever.  No signs of dehydration, tolerating PO's.  Lungs are clear. CXR without evidence of pneumonia, pneumothorax or pulmonary edema. The patient understands that symptoms are greater than the recommended 24-48 hour window of treatment.  Patient will be discharged with instructions to orally hydrate, rest, and use over-the-counter medications such as anti-inflammatories ibuprofen and Aleve for muscle aches and Tylenol for fever.  Patient will also be given a cough suppressant.   It has been determined that no acute conditions requiring further emergency intervention are present at this time. The patient/guardian have been advised of the diagnosis and plan. We have discussed signs and symptoms that warrant return to the ED, such as changes or worsening in symptoms.   Vital signs are stable at discharge.   BP 120/71  Pulse 85  Temp(Src) 100.1 F (37.8 C) (Oral)  Resp 16  SpO2 100%  Patient/guardian has voiced understanding and agreed to follow-up with the PCP or specialist.       Dierdre Forth, PA-C 10/28/13 1323

## 2013-10-28 NOTE — ED Notes (Addendum)
Pt c/o strong productive cough x 3 days. Yellow purulent sputum, recently some hemoptysis noted. Pt endorses generalized body aches, night sweats, and generalized malaise. Pt sts at work today she had an episode of dizziness and blurry vision that dissipated quickly.   Pt denies being around anyone with illness and sts has had flu shot.

## 2013-10-28 NOTE — ED Notes (Signed)
Pt in NAD, pt understanding and accepting of discharge. Pt sts will, rest, drink plenty of fluids, take prescriptions as prescribed and monitor temperature taking ibuprofen and tylenol as dosed on bottle. Pt aware she cannot drive after taking hydrocodone.

## 2013-10-28 NOTE — Discharge Instructions (Signed)
1. Medications: albuterol, hycodan, usual home medications 2. Treatment: rest, drink plenty of fluids, mucinex, tylenol/ibuprofen for myalgias and fever 3. Follow Up: Please followup with your primary doctor for discussion of your diagnoses and further evaluation after today's visit; if you do not have a primary care doctor use the resource guide provided to find one;   Influenza, Adult Influenza ("the flu") is a viral infection of the respiratory tract. It occurs more often in winter months because people spend more time in close contact with one another. Influenza can make you feel very sick. Influenza easily spreads from person to person (contagious). CAUSES  Influenza is caused by a virus that infects the respiratory tract. You can catch the virus by breathing in droplets from an infected person's cough or sneeze. You can also catch the virus by touching something that was recently contaminated with the virus and then touching your mouth, nose, or eyes. SYMPTOMS  Symptoms typically last 4 to 10 days and may include:  Fever.  Chills.  Headache, body aches, and muscle aches.  Sore throat.  Chest discomfort and cough.  Poor appetite.  Weakness or feeling tired.  Dizziness.  Nausea or vomiting. DIAGNOSIS  Diagnosis of influenza is often made based on your history and a physical exam. A nose or throat swab test can be done to confirm the diagnosis. RISKS AND COMPLICATIONS You may be at risk for a more severe case of influenza if you smoke cigarettes, have diabetes, have chronic heart disease (such as heart failure) or lung disease (such as asthma), or if you have a weakened immune system. Elderly people and pregnant women are also at risk for more serious infections. The most common complication of influenza is a lung infection (pneumonia). Sometimes, this complication can require emergency medical care and may be life-threatening. PREVENTION  An annual influenza vaccination (flu  shot) is the best way to avoid getting influenza. An annual flu shot is now routinely recommended for all adults in the [place] TREATMENT  In mild cases, influenza goes away on its own. Treatment is directed at relieving symptoms. For more severe cases, your caregiver may prescribe antiviral medicines to shorten the sickness. Antibiotic medicines are not effective, because the infection is caused by a virus, not by bacteria. HOME CARE INSTRUCTIONS  Only take over-the-counter or prescription medicines for pain, discomfort, or fever as directed by your caregiver.  Use a cool mist humidifier to make breathing easier.  Get plenty of rest until your temperature returns to normal. This usually takes 3 to 4 days.  Drink enough fluids to keep your urine clear or pale yellow.  Cover your mouth and nose when coughing or sneezing, and wash your hands well to avoid spreading the virus.  Stay home from work or school until your fever has been gone for at least 1 full day. SEEK MEDICAL CARE IF:   You have chest pain or a deep cough that worsens or produces more mucus.  You have nausea, vomiting, or diarrhea. SEEK IMMEDIATE MEDICAL CARE IF:   You have difficulty breathing, shortness of breath, or your skin or nails turn bluish.  You have severe neck pain or stiffness.  You have a severe headache, facial pain, or earache.  You have a worsening or recurring fever.  You have nausea or vomiting that cannot be controlled. MAKE SURE YOU:  Understand these instructions.  Will watch your condition.  Will get help right away if you are not doing well or get worse. Document  Released: 08/17/2000 Document Revised: 02/19/2012 Document Reviewed: 11/19/2011 [facility] Patient Information 2014 [place], [place].    Emergency Department Resource Guide 1) Find a Doctor and Pay Out of Pocket Although you won't have to find out who is covered by your insurance plan, it is a good idea to ask around and get  recommendations. You will then need to call the office and see if the doctor you have chosen will accept you as a new patient and what types of options they offer for patients who are self-pay. Some doctors offer discounts or will set up payment plans for their patients who do not have insurance, but you will need to ask so you aren't surprised when you get to your appointment.  2) Contact Your Local Health Department Not all health departments have doctors that can see patients for sick visits, but many do, so it is worth a call to see if yours does. If you don't know where your local health department is, you can check in your phone book. The CDC also has a tool to help you locate your state's health department, and many state websites also have listings of all of their local health departments.  3) Find a Walk-in Clinic If your illness is not likely to be very severe or complicated, you may want to try a walk in clinic. These are popping up all over the country in pharmacies, drugstores, and shopping centers. They're usually staffed by nurse practitioners or physician assistants that have been trained to treat common illnesses and complaints. They're usually fairly quick and inexpensive. However, if you have serious medical issues or chronic medical problems, these are probably not your best option.  No Primary Care Doctor: - Call Health Connect at  [phone] - they can help you locate a primary care doctor that  accepts your insurance, provides certain services, etc. - Physician Referral Service- [phone]  Chronic Pain Problems: Organization         Address  Phone   Notes  [facility]  [phone] Patients need to be referred by their primary care doctor.   Medication Assistance: Organization         Address  Phone   Notes  [facility] Medication [facility] [address] [place] [place], [place] [address] [phone] --Must be a resident of  [facility] -- Must have NO insurance coverage whatsoever (no Medicaid/ Medicare, etc.) -- The pt. MUST have a primary care doctor that directs their care regularly and follows them in the community   [facility]ist  [phone]   [employer]  [phone]    Agencies that provide inexpensive medical care: Organization         Address  Phone   Notes  [facility]  [phone]   [facility]    [phone]   [facility] [address] [place], [place] [address] [phone]   [facility] [address] [place] [place] [phone]   Planned Parenthood    [phone]   [facility]    [phone]   [facility]  [address], [place] Phone:  [phone], Fax:  [phone] Hours of Operation:  9 am - 6 pm, M-F.  Also accepts Medicaid/Medicare and self-pay.  [facility]  [address], [place] Phone: [phone], Fax: [phone]. Hours of Operation:  8:30 am - 5:30 pm, M-F.  Also accepts Medicaid and self-pay.  [facility] [place] [address], [place] Phone: [phone]   [facility] [address] Natasha Bence [place], [place] [phone], Ext. [phone] Mondays & Thursdays: 7-9 AM.  First 15 patients are seen on a first come, first serve basis.    Medicaid-accepting [facility] Providers:  Organization         Address  Phone   Notes  [facility] [address], [place] [phone] Also accepts self-pay patients.  [facility] [address] Laurell Josephs [place], [place]  [phone]   [facility] [address], [place] [phone]   [facility] [address] [place] [phone]   Renaye Rakers [address], [place]   [phone] Only accepts [place] Access  [place] patients after they have their name applied to their card.   Self-Pay (no insurance) in [facility]:  Organization         Address  Phone   Notes  Sickle Cell Patients, [facility] Internal Medicine [address] [place], [place] [phone]   [facility] [address] [place], [place] [phone]   [facility] [place]  [address], [place] [phone]   [facility]/Dr. Osei-Bonsu  [address] [place] or [address], [place] [phone] Phone number for both [place] and [place] locations is the same.  [facility] [address] [place] [phone]   [facility] [address], [place] or [address] [phone]   [facility] [address], [place] [phone], phone; [phone], fax Sees patients 1st and 3rd Saturday of every month.  Must not qualify for public or private insurance (i.e. Medicaid, Medicare, [place] Health Choice, Veterans' Benefits)  Household income should be no more than 200% of the poverty level The clinic cannot treat you if you are pregnant or think you are pregnant  Sexually transmitted diseases are not treated at the clinic.    Dental Care: Organization         Address  Phone  Notes  [facility] [address] [place], [place] [phone] Accepts children up to age 41 who are enrolled in [place] or [place] Health Choice; pregnant women with a Medicaid card; and children who have applied for Medicaid or [place] Health Choice, but were declined, whose parents can pay a reduced fee at time of service.  [facility] [place] [facility] Department of [facility]  [address], [place] [phone] Accepts children up to age 51 who are enrolled in [place] or [place] Health Choice; pregnant women with a Medicaid  card; and children who have applied for Medicaid or [place] Health Choice, but were declined, whose parents can pay a reduced fee at time of service.  [facility]  [address] [place], [place] [phone] Patients are seen by appointment only. Walk-ins are not accepted. [facility] will see patients 58 years of age and older. Monday - Tuesday (8am-5pm) Most Wednesdays (8:30-5pm) $30 per visit, cash only  [facility] Adult Dental Access PROGRAM  [address], [facility] [phone] Patients are seen by appointment  only. Walk-ins are not accepted. [facility] will see patients 29 years of age and older. One Wednesday Evening (Monthly: Volunteer Based).  $30 per visit, cash only  [facility]  [phone]) [phone] for adults; Children under age 44, call Graduate Pediatric Dentistry at [phone]) [phone]. Children aged 434-14, please call [phone]) [phone] to request a pediatric application.  Dental services are provided in all areas of dental care including fillings, crowns and bridges, complete and partial dentures, implants, gum treatment, root canals, and extractions. Preventive care is also provided. Treatment is provided to both adults and children. Patients are selected via a lottery and there is often a waiting list.   [facility] [address], [place]  [phone] [email or web address]   [facility] [address], [place], [place] [phone], Ext. [phone] Second and Fourth Thursday of each month, opens at 6:30 AM; Clinic ends at 9 AM.  Patients are seen on a first-come first-served basis, and a limited number are seen during each clinic.   [facility]  [address], [place], [place] [phone]   Eligibility Requirements You must have lived in [place], [place], or [place] counties for at least the last three months.   You cannot be eligible for state or federal sponsored [place]healthcare insurance,  including CIG[employer]Veterans Administration, [place]icaid, or [employer]icare.   You generally cannot be eligible for healthcare insurance through your employer.    How to apply: Eligibility screenings are held every Tuesday and Wednesday afternoon from 1:00 pm until 4:00 pm. You do not need an appointment for the interview!  [facility] [address], [place], [place] [phone]   [facility]  [phone]   [facility]  [phone]   [facility]  [phone]    Behavioral Health Resources in the Community: Intensive Outpatient Programs Organization         Address  Phone  Notes  [facility] [address], [place], [place] [phone]   [facility]patient [address], [place], [place] [phone]   [facility] [address], [place], [place]  [phone]   [facility] [address],  [place], [place] [phone] or [phone]   Substance Abuse Resources Organization         Address  Phone  Notes  [facility]  [phone]   [facility]  [phone]   [facility]  [phone]   [facility]  [phone]   Residential & Outpatient Substance Abuse Program  [phone]   Psychological Services Organization         Address  Phone  Notes  [facility]  [phone]- [phone]   [facility]  [phone]- [phone]   [facility] [address], [place] [phone] or [phone]    Mobile Crisis Teams Organization         Address  Phone  Notes  [facility], Mobile Crisis Care Unit  [phone]   [facility]  [address] [place], [place] [phone]   Doristine LocksSharon DeEsch [address] [place] [phone]    Self-Help/Support Groups Organization         Address  Phone             Notes  [facility]  of [place] - variety of support groups  [phone] Call for more information  Narcotics Anonymous (NA), [facility] [address], [employer] [place]  2 meetings at this location   Statistician         Address  Phone  Notes  [facility] [address],    [place]  [phone]   [facility]  [address] [place] [address], [place], [place] [phone]   [facility] [address] [place], [place] [phone] Admissions: 8am-3pm M-F  [facility] [address]    [place], [place] [phone]   [facility] [address] [place], [place], [place] [phone]   [facility] [address],  [place], [place] [phone]   [facility] - Intensive Outpatient [address] Laure[address] Josephs [address], [place], [place] [phone]   [facility] ([facility] [address] [place]  [place], [place] [phone] or [phone]   [facility] ([facility]) [address], [place], [place] [phone] Accepts Medicaid  [facility] [place] [address]  [place] [phone] Substance Abuse/Addiction Treatment   [facility] Organization         Address  Phone  Notes  [facility]  [phone]   Angie Fava, PhD [address] Ervin Knack [place], [place]   [phone] or [phone]   [facility]   [address] [place], [place] [phone]   [facility] [address] [place], [place] [phone] Insurance/Medicaid/sponsorship through [facility] and Families [address]                                    [place], [place] [phone] Therapy/tele-psych/case  [facility] [address], [place] [phone]    Dr. Lolly Mustache  [phone]   [facility]  [employer] [facility] Dept. 1) [address], [place] 2)  [address], [place] 3)  [address], [place] [phone]  [phone]   [facility] Child Abuse Hotline 848-812-2989 or [phone] (After Hours)

## 2014-08-07 IMAGING — CR DG CHEST 2V
2 series · 2 of 2 positions shown · non-contrast
Comparison: Chest x-ray dated December 16, 2007

CLINICAL DATA: Cough

EXAM:
CHEST  2 VIEW

[w chest pa]
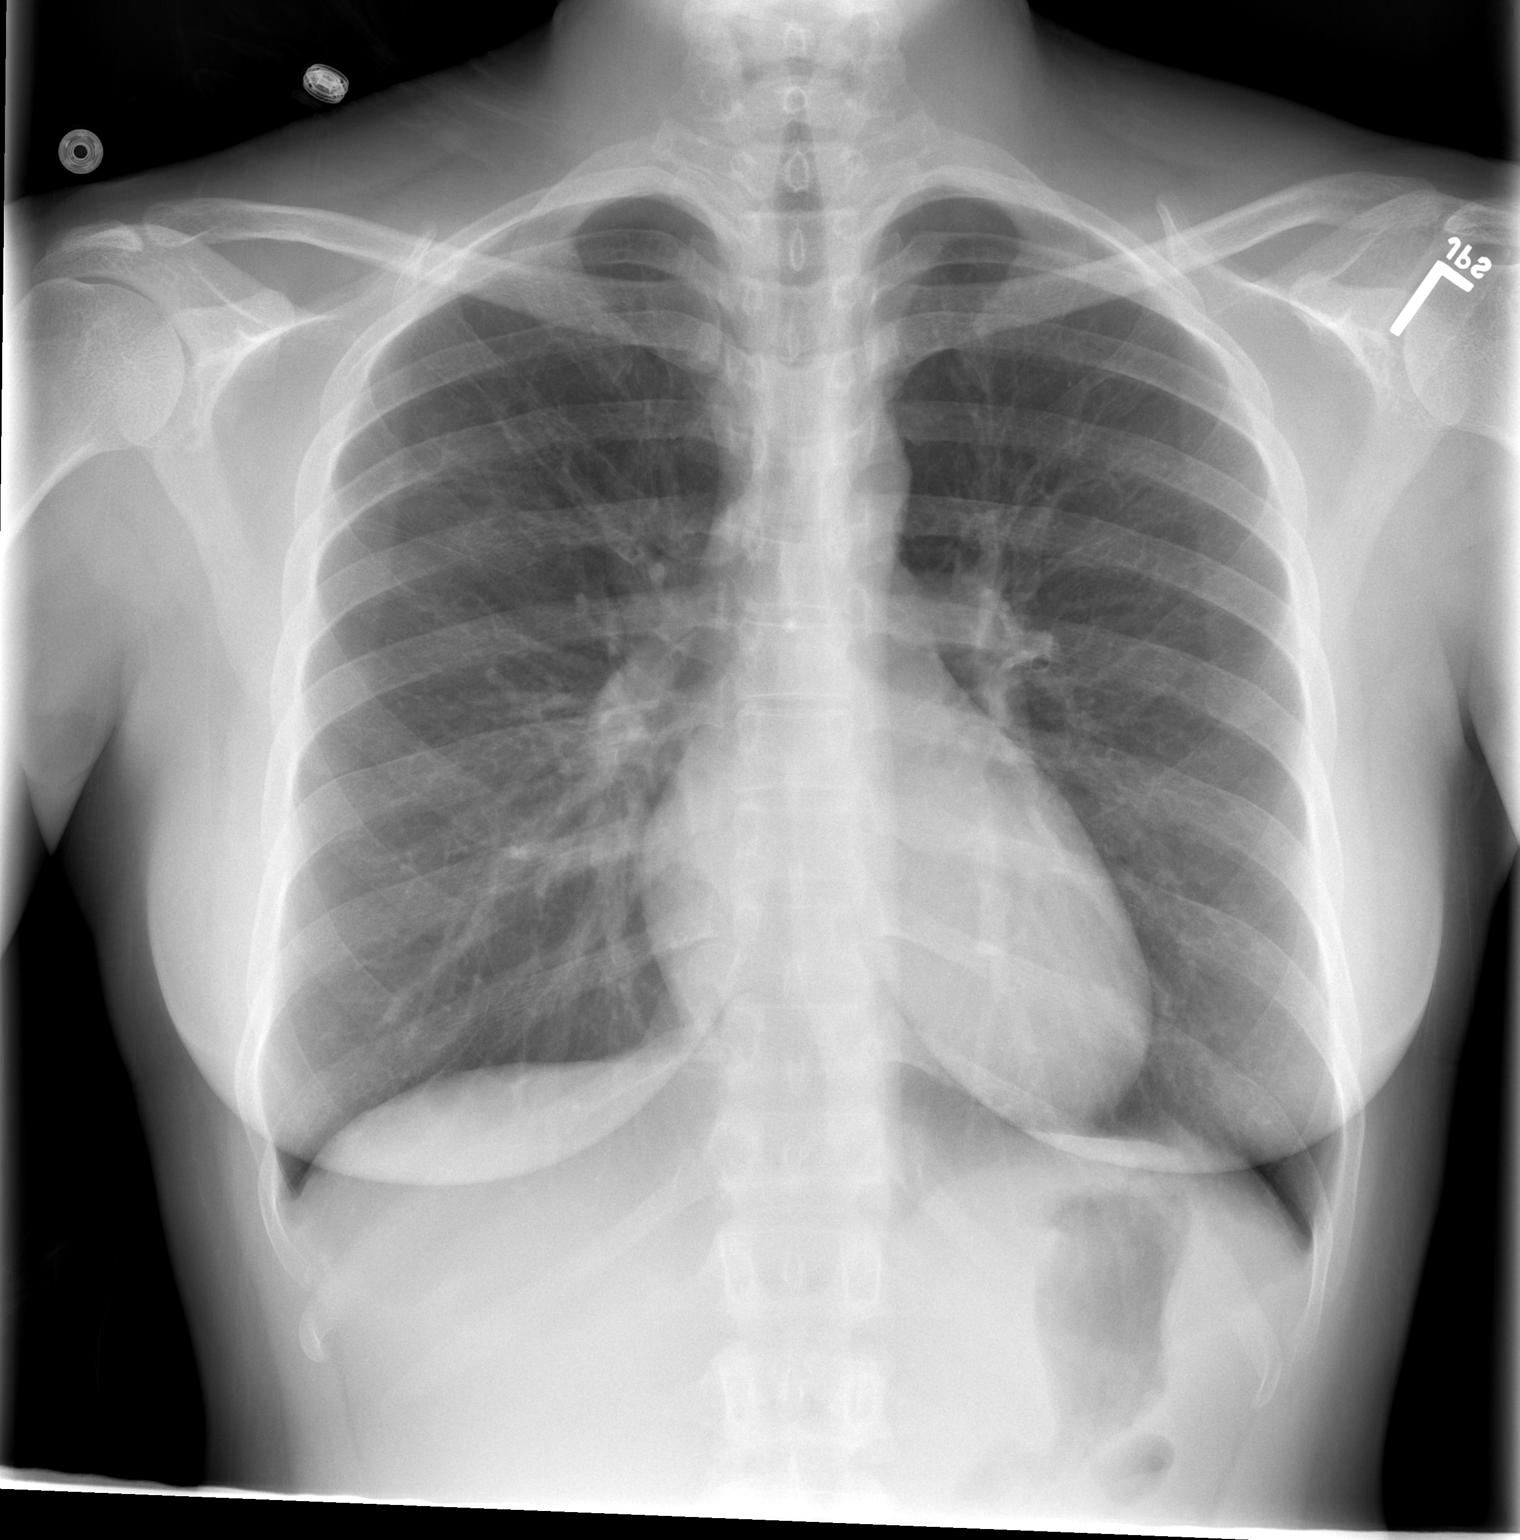

[w chest lat]
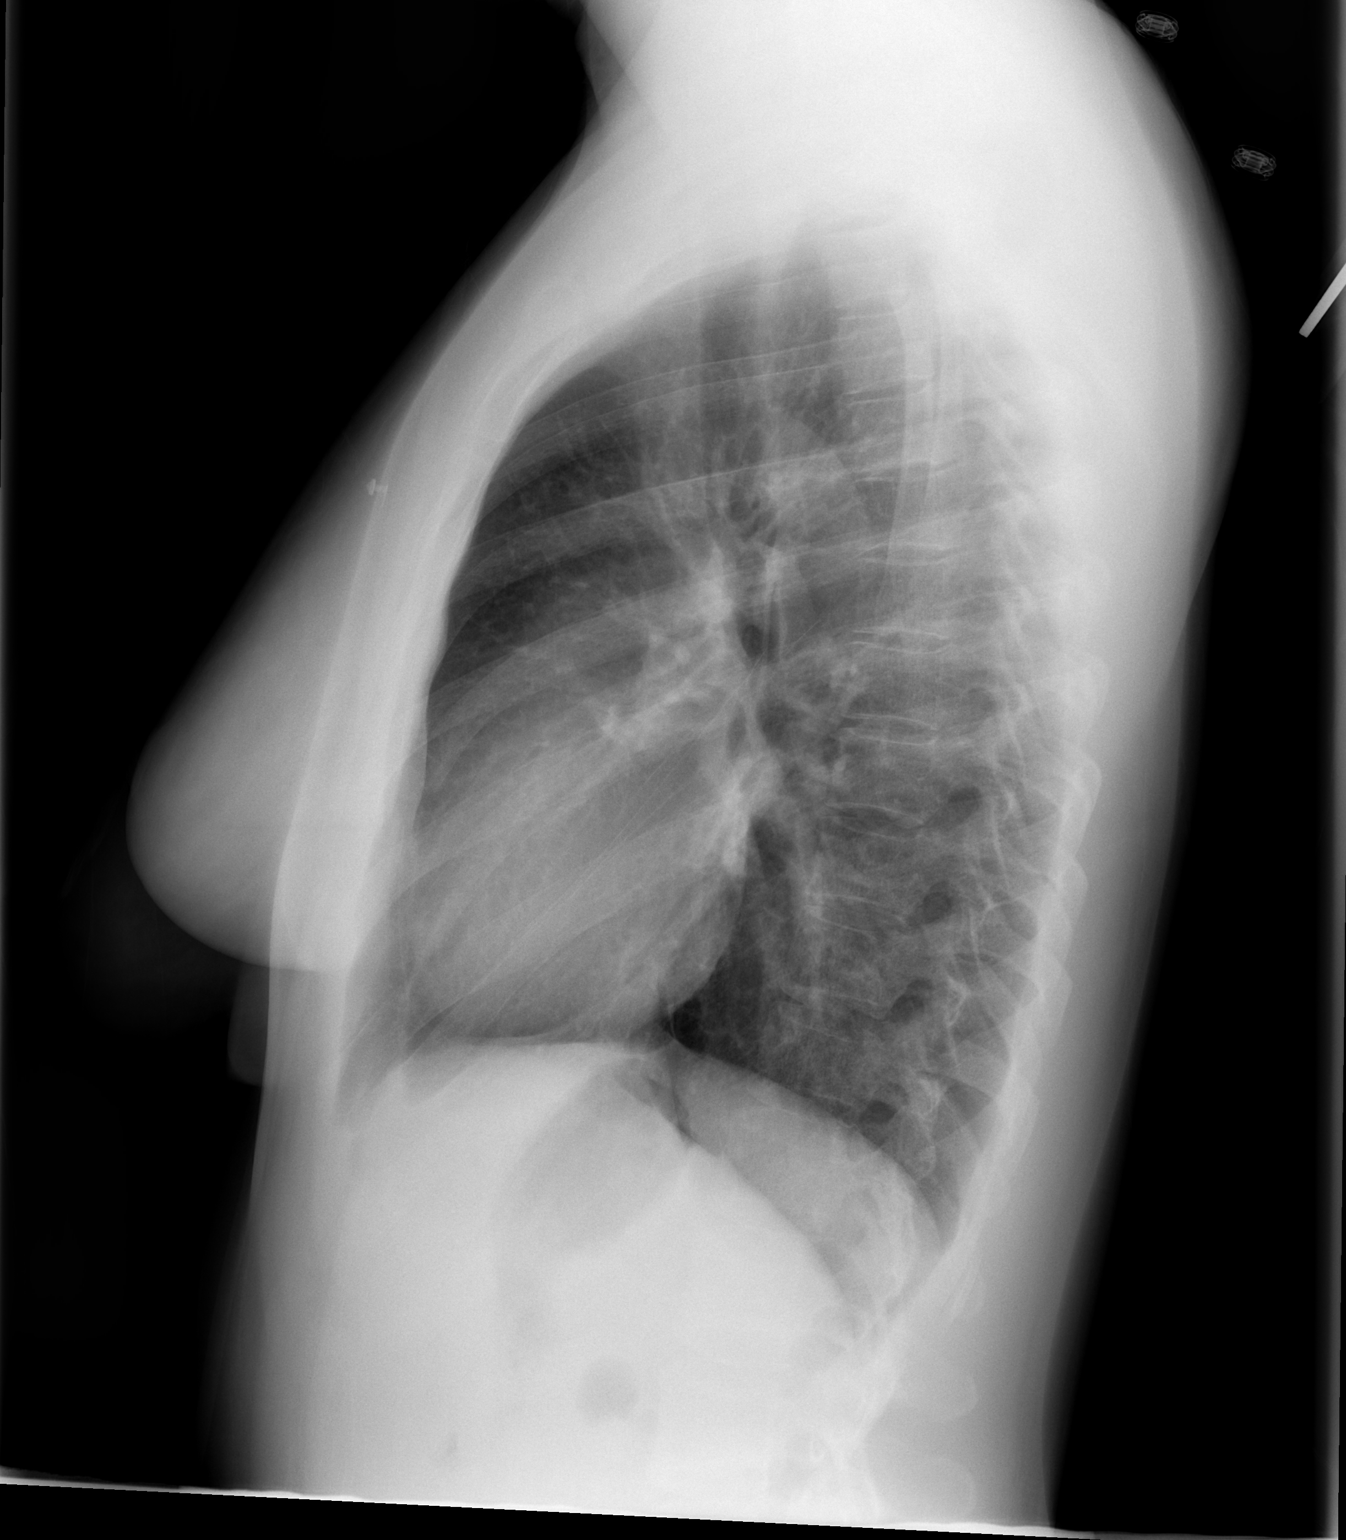

[2 of 2 positions shown; findings below may reference images not displayed]

FINDINGS: The lungs remain mildly hyperinflated. There is no focal infiltrate.
The cardiac silhouette is normal in size. The pulmonary vascularity
is not engorged. There is no pleural effusion. The mediastinum is
normal in width. The observed portions of the bony thorax appear
normal.
IMPRESSION: There is mild stable hyperinflation which may reflect underlying
COPD or reactive airway disease. There is no evidence of active
cardiopulmonary disease.

## 2019-03-17 NOTE — Progress Notes (Signed)
IP chart preload.Jenetta Downer, RN
